# Patient Record
Sex: Female | Born: 1985 | Race: White | Hispanic: No | Marital: Single
Health system: Southern US, Community
[De-identification: ages and names within clinical notes are randomized; demographics above are authoritative.]

---
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
## 2006-06-01 IMAGING — US US ABDOMEN COMPLETE
1 series · 14 of 25 positions shown · non-contrast
Comparison: none

CLINICAL DATA: Upper abdominal pain and diarrhea.

COMPLETE ABDOMEN ULTRASOUND
The gallbladder, liver, spleen, pancreas, kidneys, abdominal aorta and inferior
vena cava have normal appearances. No gallstones, biliary ductal dilatation or
free peritoneal fluid.  The common duct measures 2.1 mm in maximum diameter.
IMPRESSION
Normal examination.

[Series 1: unknown · 0.23mm/px · 14 of 60 slices shown]
[im 1/60]
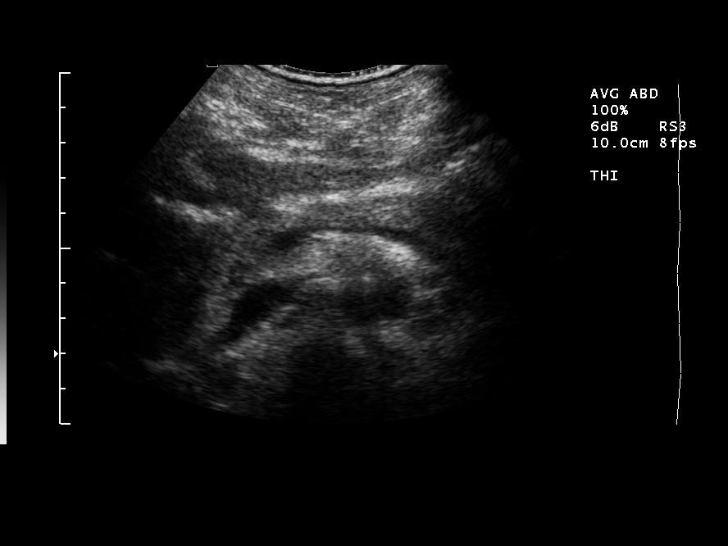
[im 5/60]
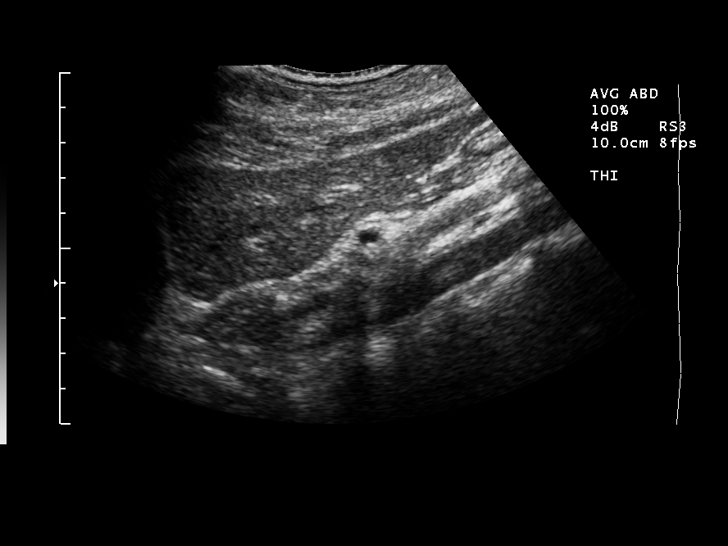
[im 10/60]
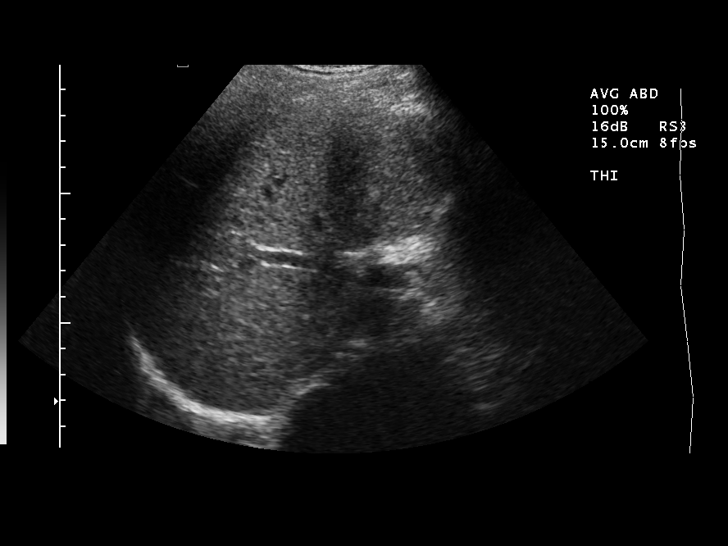
[im 15/60]
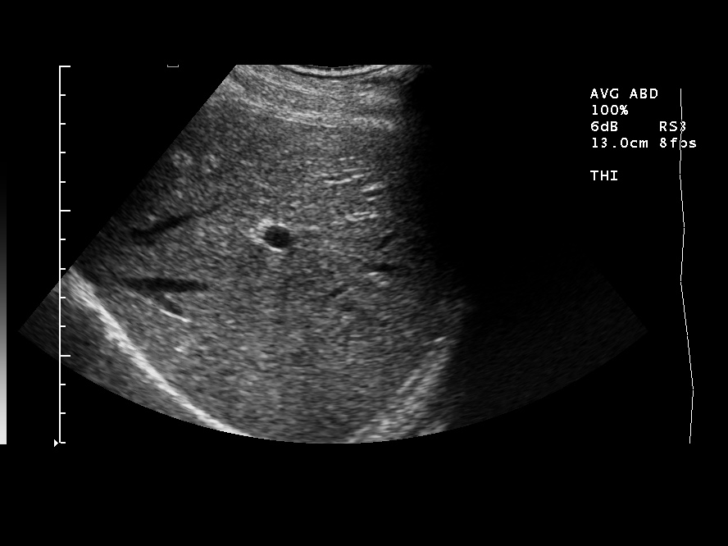
[im 20/60]
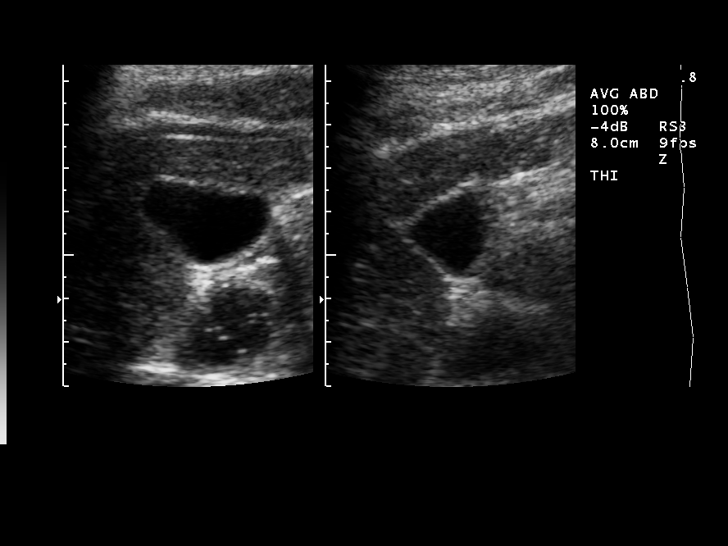
[im 23/60]
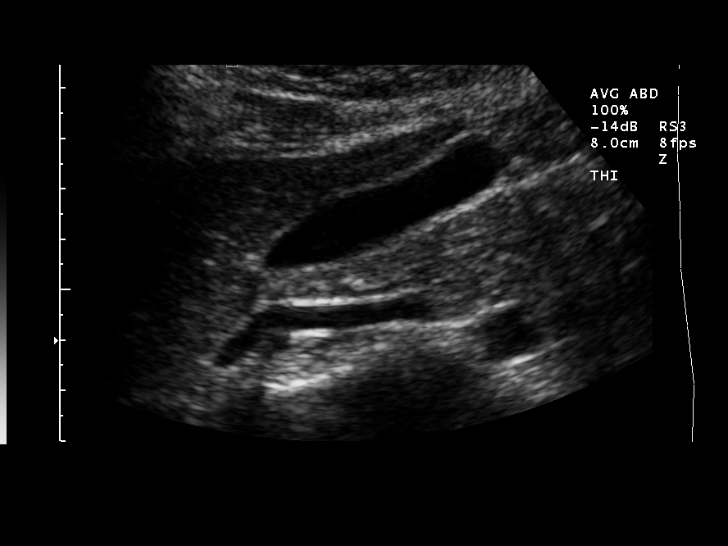
[im 28/60]
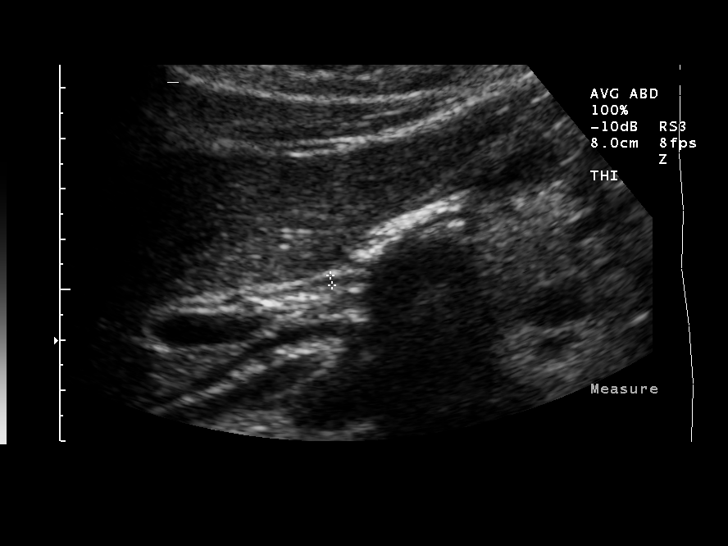
[im 32/60]
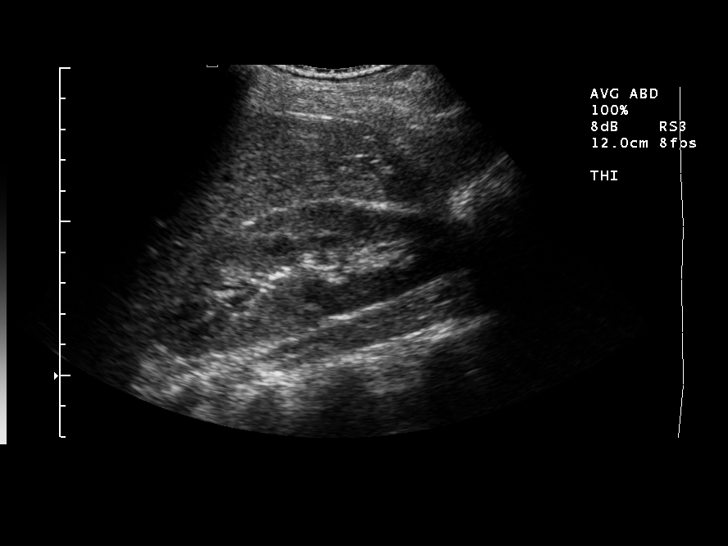
[im 37/60]
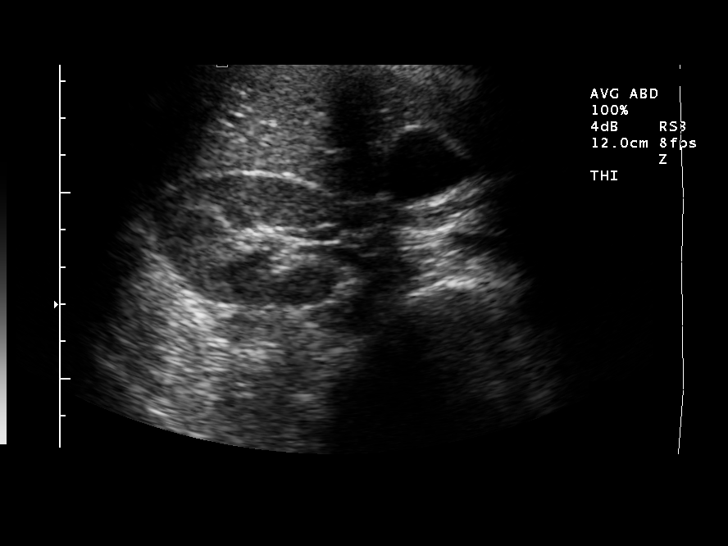
[im 40/60]
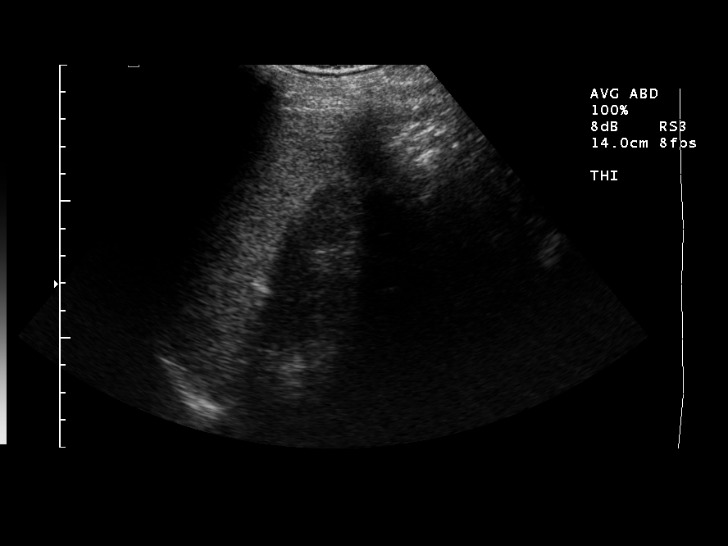
[im 45/60]
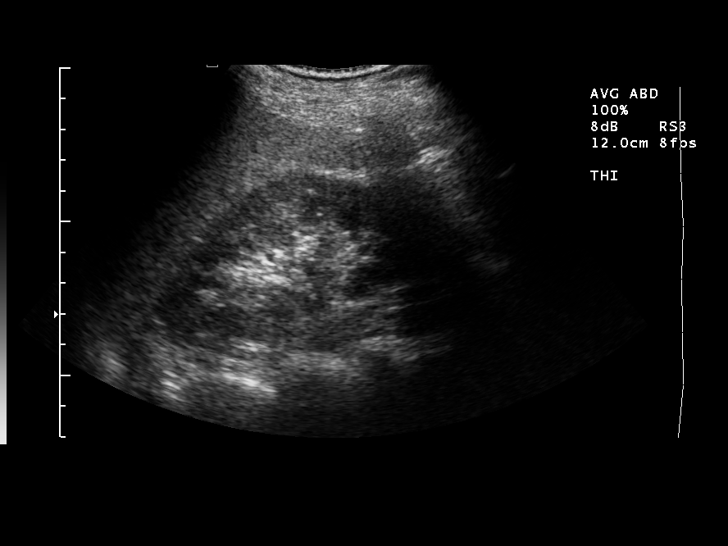
[im 50/60]
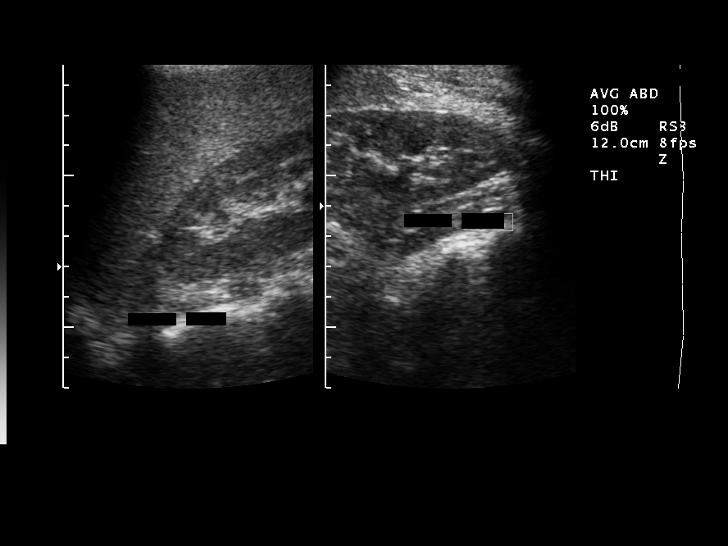
[im 55/60]
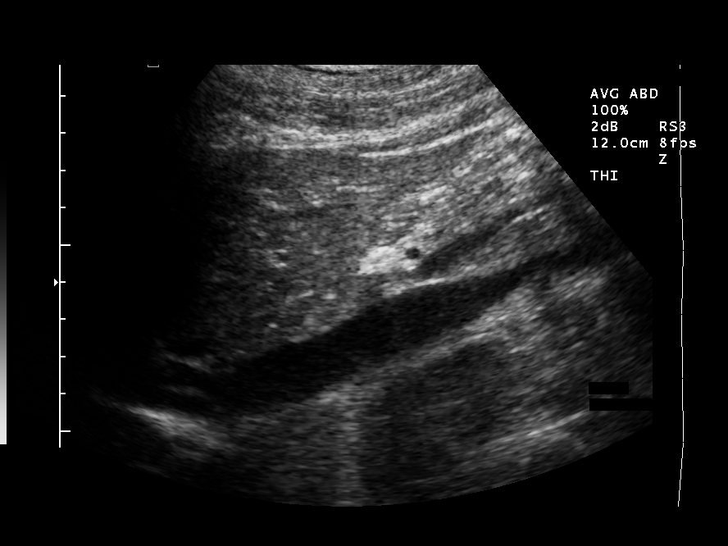
[im 60/60]
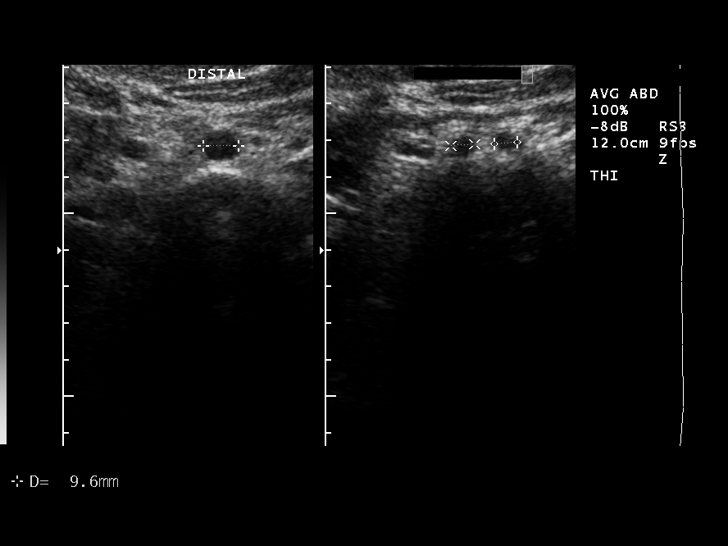

[14 of 25 positions shown; findings below may reference images not displayed]

## 2010-05-21 ENCOUNTER — Encounter: Payer: Self-pay | Admitting: Family Medicine

## 2013-07-22 ENCOUNTER — Telehealth: Payer: Self-pay | Admitting: Pediatrics

## 2013-07-22 NOTE — Telephone Encounter (Signed)
error
